# Patient Record
Sex: Male | Born: 1970 | Race: Black or African American | Hispanic: No | Marital: Single | Smoking: Former smoker
Health system: Southern US, Community
[De-identification: ages and names within clinical notes are randomized; demographics above are authoritative.]

---

## 1999-05-15 ENCOUNTER — Emergency Department (HOSPITAL_COMMUNITY): Admission: EM | Admit: 1999-05-15 | Discharge: 1999-05-15 | Payer: Self-pay | Admitting: *Deleted

## 1999-10-10 ENCOUNTER — Emergency Department (HOSPITAL_COMMUNITY): Admission: EM | Admit: 1999-10-10 | Discharge: 1999-10-10 | Payer: Self-pay | Admitting: Emergency Medicine

## 2000-05-20 ENCOUNTER — Emergency Department (HOSPITAL_COMMUNITY): Admission: EM | Admit: 2000-05-20 | Discharge: 2000-05-20 | Payer: Self-pay | Admitting: Emergency Medicine

## 2003-07-13 ENCOUNTER — Emergency Department (HOSPITAL_COMMUNITY): Admission: EM | Admit: 2003-07-13 | Discharge: 2003-07-13 | Payer: Self-pay | Admitting: Emergency Medicine

## 2005-06-21 ENCOUNTER — Emergency Department (HOSPITAL_COMMUNITY): Admission: EM | Admit: 2005-06-21 | Discharge: 2005-06-21 | Payer: Self-pay | Admitting: Emergency Medicine

## 2007-04-22 IMAGING — CR DG CHEST 2V
2 series · 2 of 2 positions shown · non-contrast
Comparison: none

HISTORY: Left chest pain

CHEST 2 VIEWS:
No prior exam for comparison.
Normal heart size, mediastinal contours, and vascularity.
Lungs clear.
No effusion or pneumothorax.
Bones unremarkable.

[w chest pa]
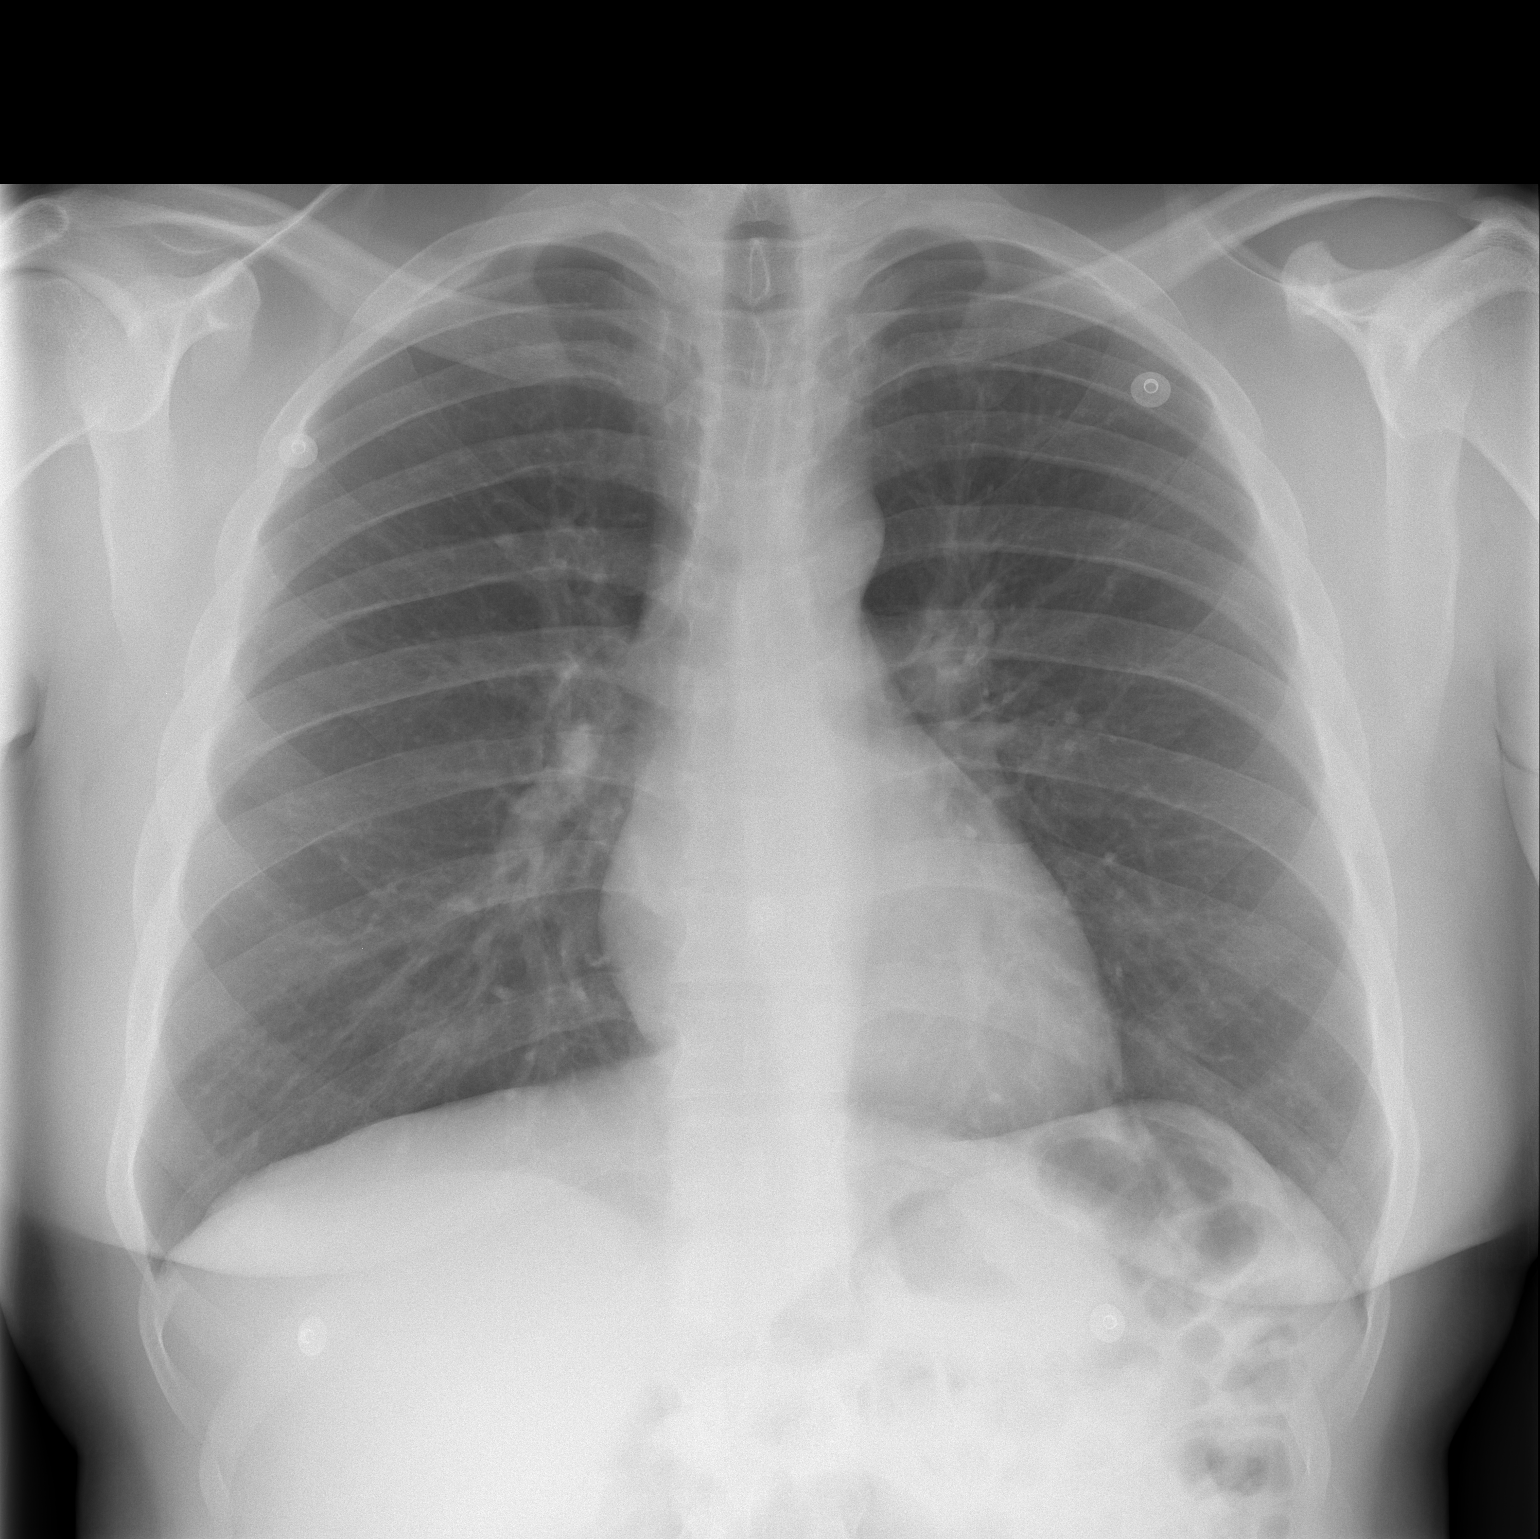

[w chest lat]
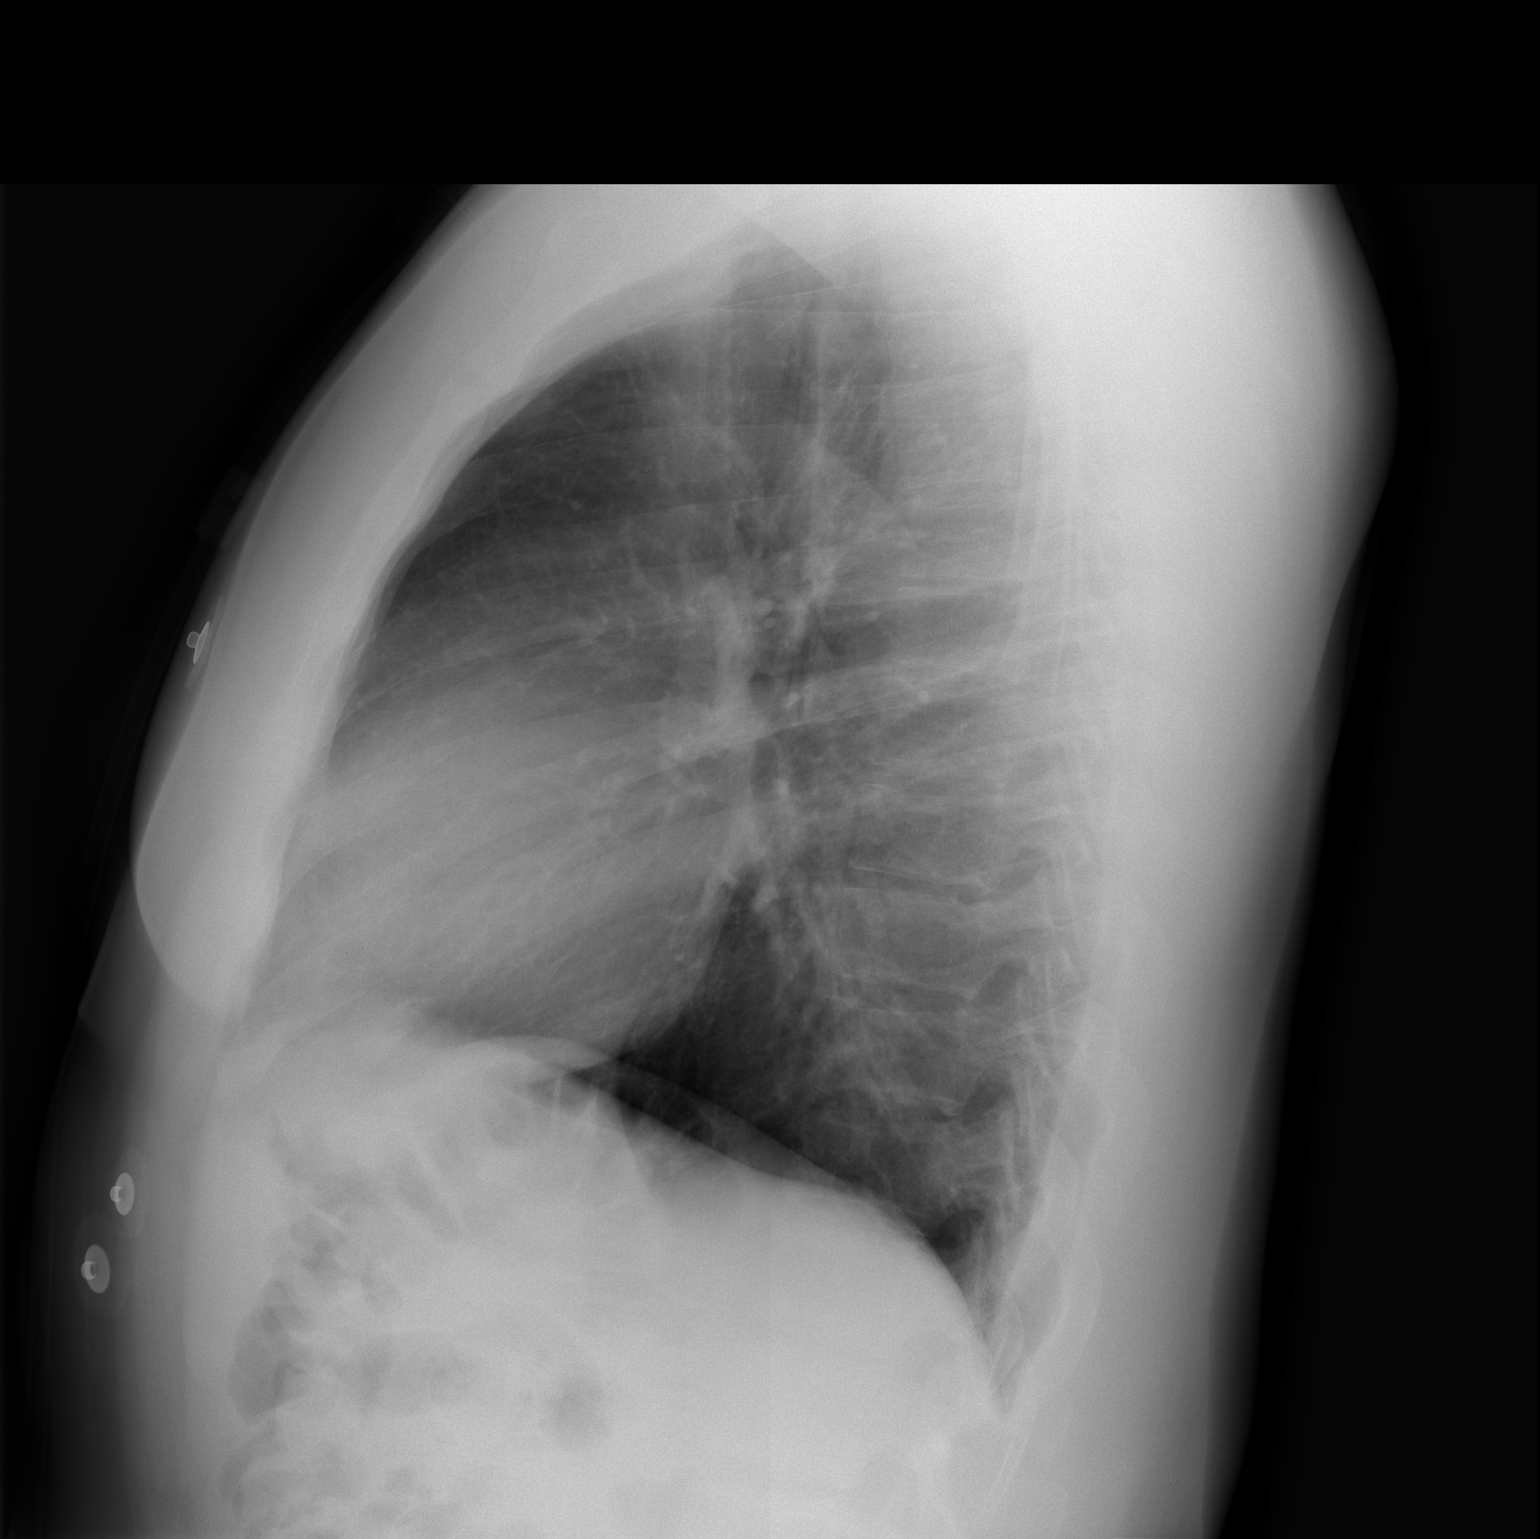

[2 of 2 positions shown; findings below may reference images not displayed]

IMPRESSION: No acute abnormalities.

## 2010-08-06 ENCOUNTER — Inpatient Hospital Stay (INDEPENDENT_AMBULATORY_CARE_PROVIDER_SITE_OTHER)
Admission: RE | Admit: 2010-08-06 | Discharge: 2010-08-06 | Disposition: A | Discharge status: Home / Self Care | Payer: Self-pay | Source: Ambulatory Visit | Attending: Pediatrics | Admitting: Pediatrics

## 2010-08-06 DIAGNOSIS — L98499 Non-pressure chronic ulcer of skin of other sites with unspecified severity: Secondary | ICD-10-CM

## 2010-08-06 LAB — GLUCOSE, CAPILLARY: Glucose-Capillary: 91 mg/dL (ref 70–99)

## 2010-08-08 LAB — WOUND CULTURE

## 2010-08-10 ENCOUNTER — Encounter (HOSPITAL_BASED_OUTPATIENT_CLINIC_OR_DEPARTMENT_OTHER): Payer: Self-pay | Attending: General Surgery

## 2010-08-10 DIAGNOSIS — L97209 Non-pressure chronic ulcer of unspecified calf with unspecified severity: Secondary | ICD-10-CM | POA: Insufficient documentation

## 2010-09-14 ENCOUNTER — Encounter (HOSPITAL_BASED_OUTPATIENT_CLINIC_OR_DEPARTMENT_OTHER): Payer: Self-pay | Attending: General Surgery

## 2010-09-14 DIAGNOSIS — L97209 Non-pressure chronic ulcer of unspecified calf with unspecified severity: Secondary | ICD-10-CM | POA: Insufficient documentation
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
## 2012-09-19 ENCOUNTER — Emergency Department (HOSPITAL_COMMUNITY)
Admission: EM | Admit: 2012-09-19 | Discharge: 2012-09-19 | Disposition: A | Discharge status: Home / Self Care | Payer: No Typology Code available for payment source | Attending: Emergency Medicine | Admitting: Emergency Medicine

## 2012-09-19 ENCOUNTER — Encounter (HOSPITAL_COMMUNITY): Payer: Self-pay | Admitting: Emergency Medicine

## 2012-09-19 DIAGNOSIS — Y9389 Activity, other specified: Secondary | ICD-10-CM | POA: Insufficient documentation

## 2012-09-19 DIAGNOSIS — Y92410 Unspecified street and highway as the place of occurrence of the external cause: Secondary | ICD-10-CM | POA: Insufficient documentation

## 2012-09-19 DIAGNOSIS — S335XXA Sprain of ligaments of lumbar spine, initial encounter: Secondary | ICD-10-CM | POA: Insufficient documentation

## 2012-09-19 DIAGNOSIS — T148XXA Other injury of unspecified body region, initial encounter: Secondary | ICD-10-CM

## 2012-09-19 MED ORDER — IBUPROFEN 600 MG PO TABS: 600.0000 mg | ORAL_TABLET | Freq: Four times a day (QID) | ORAL | Status: DC | PRN

## 2012-09-19 MED ORDER — METHOCARBAMOL 750 MG PO TABS: 750.0000 mg | ORAL_TABLET | Freq: Four times a day (QID) | ORAL | Status: DC
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
## 2012-10-11 ENCOUNTER — Emergency Department (HOSPITAL_COMMUNITY)
Admission: EM | Admit: 2012-10-11 | Discharge: 2012-10-11 | Disposition: A | Discharge status: Home / Self Care | Payer: Self-pay | Attending: Emergency Medicine | Admitting: Emergency Medicine

## 2012-10-11 DIAGNOSIS — T6391XA Toxic effect of contact with unspecified venomous animal, accidental (unintentional), initial encounter: Secondary | ICD-10-CM | POA: Insufficient documentation

## 2012-10-11 DIAGNOSIS — T63461A Toxic effect of venom of wasps, accidental (unintentional), initial encounter: Secondary | ICD-10-CM | POA: Insufficient documentation

## 2012-10-11 DIAGNOSIS — Y929 Unspecified place or not applicable: Secondary | ICD-10-CM | POA: Insufficient documentation

## 2012-10-11 DIAGNOSIS — Y939 Activity, unspecified: Secondary | ICD-10-CM | POA: Insufficient documentation

## 2012-10-11 MED ORDER — PREDNISONE 20 MG PO TABS
60.0000 mg | ORAL_TABLET | Freq: Once | ORAL | Status: AC
  Administered 2012-10-11: 60 mg via ORAL
  Filled 2012-10-11: qty 3

## 2012-10-11 MED ORDER — PREDNISONE 20 MG PO TABS: 40.0000 mg | ORAL_TABLET | Freq: Every day | ORAL | Status: DC

## 2012-10-11 MED ORDER — EPINEPHRINE 0.3 MG/0.3ML IJ SOAJ: 0.3000 mg | Freq: Once | INTRAMUSCULAR | Status: DC

## 2012-10-11 MED ORDER — DIPHENHYDRAMINE HCL 25 MG PO CAPS
50.0000 mg | ORAL_CAPSULE | Freq: Once | ORAL | Status: AC
  Administered 2012-10-11: 50 mg via ORAL
  Filled 2012-10-11: qty 2

## 2012-10-11 MED ORDER — FAMOTIDINE 20 MG PO TABS: 20.0000 mg | ORAL_TABLET | Freq: Two times a day (BID) | ORAL | Status: DC

## 2012-10-11 MED ORDER — DIPHENHYDRAMINE HCL 25 MG PO TABS: 25.0000 mg | ORAL_TABLET | Freq: Four times a day (QID) | ORAL | Status: DC

## 2012-10-11 MED ORDER — EPINEPHRINE 0.3 MG/0.3ML IJ SOAJ: 0.3000 mg | INTRAMUSCULAR | Status: DC | PRN

## 2012-10-11 MED ORDER — FAMOTIDINE 20 MG PO TABS
20.0000 mg | ORAL_TABLET | Freq: Once | ORAL | Status: AC
  Administered 2012-10-11: 20 mg via ORAL
  Filled 2012-10-11: qty 1

## 2012-10-11 NOTE — ED Provider Notes (Signed)
Patient is now 3 hours post reaction to bee sting on the lip there is no further swelling, no respiratory compromise  Patient DC home with Rx for Epipen, Benadryl, Pepcid and Prednisone   Arman Filter, NP 10/11/12 2101  Arman Filter, NP 10/11/12 2102

## 2012-10-11 NOTE — ED Provider Notes (Signed)
Medical screening examination/treatment/procedure(s) were performed by non-physician practitioner and as supervising physician I was immediately available for consultation/collaboration.  Chaos Carlile T Alajiah Dutkiewicz, MD 10/11/12 2038 
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
## 2012-10-11 NOTE — ED Notes (Signed)
Bed:WA04<BR> Expected date:<BR> Expected time:<BR> Means of arrival:<BR> Comments:<BR>

## 2012-10-11 NOTE — ED Provider Notes (Signed)
History    CSN: [id number] Arrival date & time 10/11/12  1821  First MD Initiated Contact with Patient 10/11/12 1829     Chief Complaint  Patient presents with  . Insect Bite   (Consider location/radiation/quality/duration/timing/severity/associated sxs/prior Treatment) HPI Comments: Patient presents with complaint of bee sting and facial swelling approximately 30 minutes prior to arrival. Patient was stung by a bee on the upper lip and sustained swelling of the lips and chin. No treatments prior to arrival. No difficulty breathing, mouth swelling. No wheezing. No diarrhea, hives, or lightheadedness. No previous history of allergy to bee stings. The onset of this condition was acute. The course is gradually worsening. Aggravating factors: none. Alleviating factors: none.    The history is provided by the patient.   No past medical history on file. No past surgical history on file. No family history on file. History  Substance Use Topics  . Smoking status: Never Smoker   . Smokeless tobacco: Not on file  . Alcohol Use: No    Review of Systems  Constitutional: Negative for fever.  HENT: Positive for facial swelling. Negative for trouble swallowing.   Eyes: Negative for redness.  Respiratory: Negative for shortness of breath, wheezing and stridor.   Cardiovascular: Negative for chest pain.  Gastrointestinal: Negative for nausea and vomiting.  Musculoskeletal: Negative for myalgias.  Skin: Negative for rash.  Neurological: Negative for light-headedness.  Psychiatric/Behavioral: Negative for confusion.    Allergies  Review of patient's allergies indicates no known allergies.  Home Medications   Current Outpatient Rx  Name  Route  Sig  Dispense  Refill  . Acetaminophen-Caffeine 500-65 MG TABS   Oral   Take 0.5 tablets by mouth daily as needed (for back and body pain).         Marland Kitchen ibuprofen (ADVIL,MOTRIN) 600 MG tablet   Oral   Take 1 tablet (600 mg total) by mouth  every 6 (six) hours as needed for pain.   30 tablet   0   . methocarbamol (ROBAXIN-750) 750 MG tablet   Oral   Take 1 tablet (750 mg total) by mouth 4 (four) times daily.   30 tablet   0    BP 116/66  Pulse 74  Temp(Src) 98.9 F (37.2 C) (Oral)  SpO2 99% Physical Exam  Nursing note and vitals reviewed. Constitutional: He appears well-developed and well-nourished.  HENT:  Head: Normocephalic and atraumatic.  Moderate to lip, chin, and cheek swelling. No respiratory distress or difficulty breathing, swallowing.  Eyes: Conjunctivae are normal. Right eye exhibits no discharge. Left eye exhibits no discharge.  Neck: Normal range of motion. Neck supple.  Cardiovascular: Normal rate, regular rhythm and normal heart sounds.   Pulmonary/Chest: Effort normal and breath sounds normal. He has no wheezes.  Abdominal: Soft. There is no tenderness.  Neurological: He is alert.  Skin: Skin is warm and dry.  Psychiatric: He has a normal mood and affect.    ED Course  Procedures (including critical care time) Labs Reviewed - No data to display No results found. 1. Allergic reaction to bee sting, initial encounter     6:40 PM Patient seen and examined. Facial swelling only. No anaphylaxis. No airway involvement. edications ordered.   Vital signs reviewed and are as follows: Filed Vitals:   10/11/12 1826  BP: 116/66  Pulse: 74  Temp: 98.9 F (37.2 C)   8:32 PM Handoff to Manus Rudd NP at shift change who will monitor and d/c when stable.  MDM  Facial swelling due to bee sting, no airway compromise/anaphylaxis. Continue monitoring to ensure stable.   Renne Crigler, PA-C 10/11/12 2033

## 2012-10-13 NOTE — ED Provider Notes (Signed)
Medical screening examination/treatment/procedure(s) were performed by non-physician practitioner and as supervising physician I was immediately available for consultation/collaboration.  Toy Baker, MD 10/13/12 [phone]

## 2017-08-23 ENCOUNTER — Emergency Department (HOSPITAL_COMMUNITY)
Admission: EM | Admit: 2017-08-23 | Discharge: 2017-08-23 | Disposition: A | Discharge status: Home / Self Care | Payer: No Typology Code available for payment source | Attending: Emergency Medicine | Admitting: Emergency Medicine

## 2017-08-23 ENCOUNTER — Encounter (HOSPITAL_COMMUNITY): Payer: Self-pay

## 2017-08-23 ENCOUNTER — Other Ambulatory Visit: Payer: Self-pay

## 2017-08-23 DIAGNOSIS — Y999 Unspecified external cause status: Secondary | ICD-10-CM | POA: Diagnosis not present

## 2017-08-23 DIAGNOSIS — Y929 Unspecified place or not applicable: Secondary | ICD-10-CM | POA: Insufficient documentation

## 2017-08-23 DIAGNOSIS — S3992XA Unspecified injury of lower back, initial encounter: Secondary | ICD-10-CM | POA: Diagnosis present

## 2017-08-23 DIAGNOSIS — M62830 Muscle spasm of back: Secondary | ICD-10-CM

## 2017-08-23 DIAGNOSIS — M545 Low back pain, unspecified: Secondary | ICD-10-CM

## 2017-08-23 DIAGNOSIS — S39012A Strain of muscle, fascia and tendon of lower back, initial encounter: Secondary | ICD-10-CM

## 2017-08-23 DIAGNOSIS — Y9389 Activity, other specified: Secondary | ICD-10-CM | POA: Insufficient documentation

## 2017-08-23 MED ORDER — NAPROXEN 500 MG PO TABS
500.0000 mg | ORAL_TABLET | Freq: Once | ORAL | Status: AC
  Administered 2017-08-23: 500 mg via ORAL
  Filled 2017-08-23: qty 1

## 2017-08-23 MED ORDER — CYCLOBENZAPRINE HCL 10 MG PO TABS: 10.0000 mg | ORAL_TABLET | Freq: Three times a day (TID) | ORAL | 0 refills | Status: DC | PRN

## 2017-08-23 MED ORDER — NAPROXEN 500 MG PO TABS: 500.0000 mg | ORAL_TABLET | Freq: Two times a day (BID) | ORAL | 0 refills | Status: DC | PRN

## 2017-08-23 NOTE — Discharge Instructions (Signed)
Take naprosyn as directed for inflammation and pain with tylenol for breakthrough pain and flexeril for muscle relaxation. Do not drive or operate machinery with muscle relaxant use. Use heat to areas of soreness, no more than 20 minutes at a time every hour. Expect to be sore for the next few days and follow up with primary care physician for recheck of ongoing symptoms in the next 1-2 weeks. Return to ER for emergent changing or worsening of symptoms.  °  °
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
## 2017-08-23 NOTE — ED Notes (Signed)
Bed: WTR5 Expected date:  Expected time:  Means of arrival:  Comments: 

## 2017-10-28 ENCOUNTER — Emergency Department (HOSPITAL_COMMUNITY)
Admission: EM | Admit: 2017-10-28 | Discharge: 2017-10-28 | Disposition: A | Discharge status: Home / Self Care | Payer: Self-pay | Attending: Emergency Medicine | Admitting: Emergency Medicine

## 2017-10-28 ENCOUNTER — Encounter (HOSPITAL_COMMUNITY): Payer: Self-pay | Admitting: Emergency Medicine

## 2017-10-28 DIAGNOSIS — R600 Localized edema: Secondary | ICD-10-CM | POA: Insufficient documentation

## 2017-10-28 DIAGNOSIS — F17290 Nicotine dependence, other tobacco product, uncomplicated: Secondary | ICD-10-CM | POA: Insufficient documentation

## 2017-10-28 LAB — CBC WITH DIFFERENTIAL/PLATELET
BASOS PCT: 0 %
Basophils Absolute: 0 10*3/uL (ref 0.0–0.1)
EOS ABS: 0.2 10*3/uL (ref 0.0–0.7)
EOS PCT: 3 %
HCT: 40.8 % (ref 39.0–52.0)
HEMOGLOBIN: 13.5 g/dL (ref 13.0–17.0)
Lymphocytes Relative: 31 %
Lymphs Abs: 2.3 10*3/uL (ref 0.7–4.0)
MCH: 31 pg (ref 26.0–34.0)
MCHC: 33.1 g/dL (ref 30.0–36.0)
MCV: 93.8 fL (ref 78.0–100.0)
Monocytes Absolute: 0.6 10*3/uL (ref 0.1–1.0)
Monocytes Relative: 8 %
NEUTROS PCT: 58 %
Neutro Abs: 4.3 10*3/uL (ref 1.7–7.7)
PLATELETS: 201 10*3/uL (ref 150–400)
RBC: 4.35 MIL/uL (ref 4.22–5.81)
RDW: 14.4 % (ref 11.5–15.5)
WBC: 7.4 10*3/uL (ref 4.0–10.5)

## 2017-10-28 LAB — COMPREHENSIVE METABOLIC PANEL
ALK PHOS: 105 U/L (ref 38–126)
ALT: 42 U/L (ref 0–44)
AST: 26 U/L (ref 15–41)
Albumin: 3.4 g/dL — ABNORMAL LOW (ref 3.5–5.0)
Anion gap: 6 (ref 5–15)
BUN: 18 mg/dL (ref 6–20)
CALCIUM: 9.1 mg/dL (ref 8.9–10.3)
CO2: 28 mmol/L (ref 22–32)
Chloride: 106 mmol/L (ref 98–111)
Creatinine, Ser: 1.1 mg/dL (ref 0.61–1.24)
Glucose, Bld: 97 mg/dL (ref 70–99)
Potassium: 4.4 mmol/L (ref 3.5–5.1)
Sodium: 140 mmol/L (ref 135–145)
Total Protein: 6.3 g/dL — ABNORMAL LOW (ref 6.5–8.1)

## 2017-10-28 NOTE — ED Provider Notes (Signed)
[facility]-EMERGENCY DEPT Provider Note   CSN: [id number] Arrival date & time: 10/28/17  7829     History   Chief Complaint Chief Complaint  Patient presents with  . Leg Swelling    HPI [name] is a 47 y.o. male.  HPI  47 year old male presents with bilateral lower extremity swelling.  Started 2 days ago.  It was at its worst last night and slightly improved this morning.  He is never had this before.  The swelling seems to be slightly worse on the left foot than the right but overall the swelling is equal and goes up to his bilateral knees.  There is no chest pain, shortness of breath.  No abdominal pain or distention.  No swelling in his arms or hands.  He has not tried anything for this.  He denies any significant past medical history.  No recent travel or immobilization.  History reviewed. No pertinent past medical history.  There are no active problems to display for this patient.   History reviewed. No pertinent surgical history.      Home Medications    Prior to Admission medications   Medication Sig Start Date End Date Taking? Authorizing Provider  cyclobenzaprine (FLEXERIL) 10 MG tablet Take 1 tablet (10 mg total) by mouth 3 (three) times daily as needed for muscle spasms. Patient not taking: Reported on 10/28/2017 08/23/17   Street, [place], PA-C  diphenhydrAMINE (BENADRYL) 25 MG tablet Take 1 tablet (25 mg total) by mouth every 6 (six) hours. Patient not taking: Reported on 10/28/2017 10/11/12   Renne Crigler, PA-C  EPINEPHrine (EPIPEN) 0.3 mg/0.3 mL SOAJ Inject 0.3 mLs (0.3 mg total) into the muscle as needed. Patient not taking: Reported on 10/28/2017 10/11/12   Renne Crigler, PA-C  famotidine (PEPCID) 20 MG tablet Take 1 tablet (20 mg total) by mouth 2 (two) times daily. Patient not taking: Reported on 10/28/2017 10/11/12   Renne Crigler, PA-C  ibuprofen (ADVIL,MOTRIN) 600 MG tablet Take 1 tablet (600 mg total) by mouth every 6 (six)  hours as needed for pain. Patient not taking: Reported on 10/28/2017 09/19/12   Lorre Nick, MD  methocarbamol (ROBAXIN-750) 750 MG tablet Take 1 tablet (750 mg total) by mouth 4 (four) times daily. Patient not taking: Reported on 10/28/2017 09/19/12   Lorre Nick, MD  naproxen (NAPROSYN) 500 MG tablet Take 1 tablet (500 mg total) by mouth 2 (two) times daily as needed for mild pain, moderate pain or headache (TAKE WITH MEALS.). Patient not taking: Reported on 10/28/2017 08/23/17   Street, [place], PA-C  predniSONE (DELTASONE) 20 MG tablet Take 2 tablets (40 mg total) by mouth daily. Patient not taking: Reported on 10/28/2017 10/11/12   Renne Crigler, PA-C    Family History No family history on file.  Social History Social History   Tobacco Use  . Smoking status: Light Tobacco Smoker    Types: Cigars  . Smokeless tobacco: Never Used  Substance Use Topics  . Alcohol use: No  . Drug use: No     Allergies   Bee venom   Review of Systems Review of Systems  Respiratory: Negative for shortness of breath.   Cardiovascular: Positive for leg swelling. Negative for chest pain.  Musculoskeletal: Negative for myalgias.  Neurological: Negative for weakness and numbness.  All other systems reviewed and are negative.    Physical Exam Updated Vital Signs BP 102/80 (BP Location: Left Arm)   Pulse (!) 55   Temp 98.6 F (37  C) (Oral)   Resp 20   SpO2 98%   Physical Exam  Constitutional: He is oriented to person, place, and time. He appears well-developed and well-nourished.  HENT:  Head: Normocephalic and atraumatic.  Right Ear: External ear normal.  Left Ear: External ear normal.  Nose: Nose normal.  Eyes: Right eye exhibits no discharge. Left eye exhibits no discharge.  Neck: Neck supple.  Cardiovascular: Normal rate, regular rhythm and normal heart sounds.  Pulses:      Dorsalis pedis pulses are 2+ on the right side, and 2+ on the left side.  Pulmonary/Chest: Effort normal  and breath sounds normal. He has no wheezes. He has no rales.  Abdominal: Soft. He exhibits no distension. There is no tenderness.  Musculoskeletal: He exhibits edema.  pitting edema to BLE. Pitting goes to proximal lower legs. Swelling is most prominent in ankles and feet. Normal ROM of ankles.   Neurological: He is alert and oriented to person, place, and time.  Skin: Skin is warm and dry.  Nursing note and vitals reviewed.    ED Treatments / Results  Labs (all labs ordered are listed, but only abnormal results are displayed) Labs Reviewed  COMPREHENSIVE METABOLIC PANEL - Abnormal; Notable for the following components:      Result Value   Total Protein 6.3 (*)    Albumin 3.4 (*)    Total Bilirubin <0.1 (*)    All other components within normal limits  CBC WITH DIFFERENTIAL/PLATELET    EKG None  Radiology No results found.  Procedures Procedures (including critical care time)  Medications Ordered in ED Medications - No data to display   Initial Impression / Assessment and Plan / ED Course  I have reviewed the triage vital signs and the nursing notes.  Pertinent labs & imaging results that were available during my care of the patient were reviewed by me and considered in my medical decision making (see chart for details).     Patient's exam shows pitting edema but he has no other signs or symptoms of concern such as pulmonary edema/dyspnea or abdominal distention.  His lab work is unremarkable except minimally decreased albumin.  However I do not think this is low enough to cause diffuse edema.  I think this is more of a peripheral cause and he should use compression stockings and elevate his legs when not on his feet.  We have discussed return precautions but that there it appears to be no emergent condition and he can be discharged to follow-up with a PCP.  I highly doubt bilateral DVT do not think ultrasounds are needed.  Final Clinical Impressions(s) / ED Diagnoses    Final diagnoses:  Bilateral lower extremity edema    ED Discharge Orders    None       Pricilla LovelessGoldston, Johnny Gorter, MD 10/28/17 1531

## 2017-10-28 NOTE — Discharge Instructions (Addendum)
Use compression stockings and elevate your legs whenever you are not up and walking around to help decrease the leg swelling.  If you develop chest pain, shortness of breath, trouble laying flat or waking up in the middle the night due to trouble breathing or any other new/concerning symptoms and return to the ER for evaluation.

## 2017-10-28 NOTE — ED Triage Notes (Signed)
Patient here from work with complaints of bilateral leg and ankle swelling that started Sunday.

## 2017-10-28 NOTE — ED Notes (Signed)
Pt had drawn for labs:  Gold Blue Lavender Lt green  dk green x2 

## 2017-11-18 ENCOUNTER — Emergency Department (HOSPITAL_COMMUNITY)
Admission: EM | Admit: 2017-11-18 | Discharge: 2017-11-18 | Disposition: A | Discharge status: Home / Self Care | Payer: Self-pay | Attending: Emergency Medicine | Admitting: Emergency Medicine

## 2017-11-18 ENCOUNTER — Emergency Department (HOSPITAL_COMMUNITY): Payer: Self-pay

## 2017-11-18 ENCOUNTER — Encounter (HOSPITAL_COMMUNITY): Payer: Self-pay | Admitting: *Deleted

## 2017-11-18 ENCOUNTER — Other Ambulatory Visit: Payer: Self-pay

## 2017-11-18 DIAGNOSIS — R0789 Other chest pain: Secondary | ICD-10-CM | POA: Insufficient documentation

## 2017-11-18 DIAGNOSIS — Z8249 Family history of ischemic heart disease and other diseases of the circulatory system: Secondary | ICD-10-CM | POA: Insufficient documentation

## 2017-11-18 DIAGNOSIS — F17210 Nicotine dependence, cigarettes, uncomplicated: Secondary | ICD-10-CM | POA: Insufficient documentation

## 2017-11-18 DIAGNOSIS — R202 Paresthesia of skin: Secondary | ICD-10-CM | POA: Insufficient documentation

## 2017-11-18 LAB — I-STAT TROPONIN, ED
Troponin i, poc: 0 ng/mL (ref 0.00–0.08)
Troponin i, poc: 0 ng/mL (ref 0.00–0.08)

## 2017-11-18 LAB — BASIC METABOLIC PANEL
ANION GAP: 8 (ref 5–15)
BUN: 24 mg/dL — ABNORMAL HIGH (ref 6–20)
CHLORIDE: 106 mmol/L (ref 98–111)
CO2: 25 mmol/L (ref 22–32)
Calcium: 9.2 mg/dL (ref 8.9–10.3)
Creatinine, Ser: 1.26 mg/dL — ABNORMAL HIGH (ref 0.61–1.24)
GFR calc Af Amer: >60 mL/min (ref >60)
GFR calc non Af Amer: >60 mL/min (ref >60)
GLUCOSE: 103 mg/dL — AB (ref 70–99)
POTASSIUM: 4.1 mmol/L (ref 3.5–5.1)
Sodium: 139 mmol/L (ref 135–145)

## 2017-11-18 LAB — CBC
HEMATOCRIT: 43.6 % (ref 39.0–52.0)
HEMOGLOBIN: 14.6 g/dL (ref 13.0–17.0)
MCH: 31.4 pg (ref 26.0–34.0)
MCHC: 33.5 g/dL (ref 30.0–36.0)
MCV: 93.8 fL (ref 78.0–100.0)
Platelets: 245 10*3/uL (ref 150–400)
RBC: 4.65 MIL/uL (ref 4.22–5.81)
RDW: 14.8 % (ref 11.5–15.5)
WBC: 6.6 10*3/uL (ref 4.0–10.5)

## 2017-11-18 NOTE — ED Provider Notes (Signed)
[facility]-EMERGENCY DEPT Provider Note   CSN: [id number] Arrival date & time: 11/18/17  0556     History   Chief Complaint Chief Complaint  Patient presents with  . Chest Pain    HPI Evan Alvarez is a 47 y.o. male with no known past medical history is here for evaluation of chest pain.  Chest pain described as tightness or "someone sitting right there", on the left side of his chest.  Has some left arm discomfort like a "muscle or pinching feeling", tingling in his left arm with tightness.  Had a brief episode of shortness of breath that he describes as breathing harder and gasping but this lasted a few seconds, resolved on its own and not reoccured.  Also had a brief headache that resolved.  Pain began this morning around 4 AM after he woke up and was getting ready for work.  Pain is intermittent, lasting 10 to 15 minutes.  Nothing seems to make it better, happens at rest as well.  Nonpleuritic.  Nonexertional.  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  Does feel like using his left arm makes the pain worse and feels a "pull" to the left side of his chest as well.  No tobacco use.  Rare EtOH use.  Remote history of marijuana use.  Father died of heart attack at 4855.  Older brother had ?Stroke or heart attack recently, unsure.  Was in the ER last month for leg swelling bilaterally, thought right may have been bigger however this resolved after wearing compression stockings.  He has no calf pain.  No history of PE/DVT.  No recent prolonged travel or immobilization.  No sweats, chills, fevers, chest congestion, cough, nausea, vomiting, dizziness, light-headedness. One time last year had similar symptoms but resolved on its own.  No daily medicine. No medical problems. No PCP. Currently minimal left chest tightness and left arm feels cramping.   HPI  History reviewed. No pertinent past medical  history.  There are no active problems to display for this patient.   History reviewed. No pertinent surgical history.      Home Medications    Prior to Admission medications   Medication Sig Start Date End Date Taking? Authorizing Provider  naproxen (NAPROSYN) 500 MG tablet Take 1 tablet (500 mg total) by mouth 2 (two) times daily as needed for mild pain, moderate pain or headache (TAKE WITH MEALS.). 08/23/17  Yes Street, [place]Mercedes, PA-C  cyclobenzaprine (FLEXERIL) 10 MG tablet Take 1 tablet (10 mg total) by mouth 3 (three) times daily as needed for muscle spasms. Patient not taking: Reported on 10/28/2017 08/23/17   Street, HarperMercedes, PA-C  EPINEPHrine (EPIPEN) 0.3 mg/0.3 mL SOAJ Inject 0.3 mLs (0.3 mg total) into the muscle as needed. Patient not taking: Reported on 10/28/2017 10/11/12   Renne CriglerGeiple, Joshua, PA-C    Family History No family history on file.  Social History Social History   Tobacco Use  . Smoking status: Light Tobacco Smoker    Types: Cigars  . Smokeless tobacco: Never Used  Substance Use Topics  . Alcohol use: No  . Drug use: No     Allergies   Bee venom   Review of Systems Review of Systems  Respiratory: Positive for shortness of breath (resolved).   Cardiovascular: Positive for chest pain.  Musculoskeletal: Positive for myalgias (left arm tightness).  Neurological:       Left arm  paresthesia  All other systems reviewed and are negative.    Physical Exam Updated Vital Signs BP 115/76 (BP Location: Left Arm)   Pulse (!) 53   Temp 97.7 F (36.5 C)   Resp 13   SpO2 98%   Physical Exam  Constitutional: He appears well-developed and well-nourished.  NAD. Non toxic.   HENT:  Head: Normocephalic and atraumatic.  Nose: Nose normal.  Eyes: Conjunctivae, EOM and lids are normal.  Neck: Trachea normal and normal range of motion.  Trachea midline.   Cardiovascular: Normal rate, regular rhythm, S1 normal, S2 normal and normal heart sounds.  Pulses:       Carotid pulses are 2+ on the right side, and 2+ on the left side.      Radial pulses are 2+ on the right side, and 2+ on the left side.       Dorsalis pedis pulses are 2+ on the right side, and 2+ on the left side.  No LE edema or calf tenderness.   Pulmonary/Chest: Effort normal and breath sounds normal.  TTP to left upper chest and anterior axillary region. CP reproducible with AROM of left upper extremity against resistance. No pain with deep inspiration.   Abdominal: Soft. Bowel sounds are normal. There is no tenderness.  No epigastric tenderness. No distention.   Neurological: He is alert. GCS eye subscore is 4. GCS verbal subscore is 5. GCS motor subscore is 6.  5/5 strength hand grip bilaterally. Sensation to light touch intact in hands.   Skin: Skin is warm and dry. Capillary refill takes less than 2 seconds.  No rash to chest wall  Psychiatric: His speech is normal and behavior is normal. Thought content normal. Cognition and memory are normal.     ED Treatments / Results  Labs (all labs ordered are listed, but only abnormal results are displayed) Labs Reviewed  BASIC METABOLIC PANEL - Abnormal; Notable for the following components:      Result Value   Glucose, Bld 103 (*)    BUN 24 (*)    Creatinine, Ser 1.26 (*)    All other components within normal limits  CBC  I-STAT TROPONIN, ED  I-STAT TROPONIN, ED    EKG EKG Interpretation  Date/Time:  Tuesday November 18 2017 06:04:10 EDT Ventricular Rate:  61 PR Interval:    QRS Duration: 62 QT Interval:  354 QTC Calculation: 357 R Axis:   37 Text Interpretation:  Sinus arrhythmia Probable left atrial enlargement Low voltage, precordial leads No significant change since last tracing Confirmed by Shaune PollackIsaacs, Cameron [phone]([id number]) on 11/18/2017 7:44:53 AM   Radiology Dg Chest 2 View  Result Date: 11/18/2017 CLINICAL DATA:  New onset left mid chest pain radiating down the left arm since 400 hours this morning. Nonsmoker. EXAM:  CHEST - 2 VIEW COMPARISON:  06/21/2005 FINDINGS: The heart size and mediastinal contours are within normal limits. Both lungs are clear. The visualized skeletal structures are unremarkable. IMPRESSION: No active cardiopulmonary disease. Electronically Signed   By: Burman NievesWilliam  Stevens M.D.   On: 11/18/2017 06:49    Procedures Procedures (including critical care time)  Medications Ordered in ED Medications - No data to display   Initial Impression / Assessment and Plan / ED Course  I have reviewed the triage vital signs and the nursing notes.  Pertinent labs & imaging results that were available during my care of the patient were reviewed by me and considered in my medical decision making (see chart for details).  Clinical  Course as of Nov 19 1038  Tue Nov 18, 2017  6578 Creatinine(!): 1.26 [CG]    Clinical Course User Index [CG] Liberty Handy, PA-C    Pt is a 47 y.o. male presents with CP.  Symptoms have been intermittent, non pleuritic, non exertional.  Worse with AROM of arm, does heavy lifting at work.  Pertinent risk factors include elevated BMI, positive family hx.  On exam VS are wnl. Cardiovascular and pulmonary exam benign. CXR, EKG, troponin x 2 within normal limits.  CBC and BMP unremarkable. I have low suspicion for PE, no tachycardia, tachypnea, pleuritic CP, calf pain or swelling, or recent travel/prolonged immobilization. PERC negative. Heart score is low.  Given symptoms, reassuring ED work up, low risk HEART score patient will be discharged with recommendation to follow up with PCP and cardiologist in regards to today's hospital visit. ED return preacutions given. Pt appears reliable for follow up and is agreeable to discharge.    Final Clinical Impressions(s) / ED Diagnoses   Final diagnoses:  Atypical chest pain    ED Discharge Orders    None       Liberty Handy, PA-C 11/18/17 1040    Cardama, Amadeo Garnet, MD 11/19/17 [phone]

## 2017-11-18 NOTE — Discharge Instructions (Addendum)
You were evaluated in the emergency department for chest pain.    Based on your risk factors, work up and exam you are considered low risk for major adverse cardiac events in the next 30 days.  This means you can be discharged with close follow up with cardiology for further outpatient work up.   Call cardiology as soon as possible to establish care and further discussion and work up of your symptoms on an outpatient setting  Please return to ED if: Your chest pain is worse. You have a cough that gets worse, or you cough up blood. You have severe pain in your abdomen. You have severe weakness. You faint. You have sudden, unexplained chest discomfort. You have sudden, unexplained discomfort in your arms, back, neck, or jaw. You have shortness of breath at any time. You suddenly start to sweat, or your skin gets clammy. You feel nauseous or you vomit. You suddenly feel light-headed or dizzy. Your heart begins to beat quickly, or it feels like it is skipping beats.  Contact [facility] to establish care with a primary care provider for regular, routine medical care.  This clinic accepts patients without medical insurance. A primary care provider can adjust your daily medications and give you refills.

## 2017-11-18 NOTE — ED Triage Notes (Signed)
Pt arrives with c/o cp that started about 4am when it awoke him in his sleep. Describes as a pressure on the left sided of his chest, at worse was a 7/10. Having some pain in the left arm and mid back area.

## 2017-11-23 ENCOUNTER — Emergency Department (HOSPITAL_BASED_OUTPATIENT_CLINIC_OR_DEPARTMENT_OTHER)
Admission: EM | Admit: 2017-11-23 | Discharge: 2017-11-23 | Disposition: A | Discharge status: Home / Self Care | Payer: Self-pay | Attending: Emergency Medicine | Admitting: Emergency Medicine

## 2017-11-23 ENCOUNTER — Other Ambulatory Visit: Payer: Self-pay

## 2017-11-23 ENCOUNTER — Encounter (HOSPITAL_BASED_OUTPATIENT_CLINIC_OR_DEPARTMENT_OTHER): Payer: Self-pay | Admitting: Emergency Medicine

## 2017-11-23 ENCOUNTER — Emergency Department (HOSPITAL_BASED_OUTPATIENT_CLINIC_OR_DEPARTMENT_OTHER): Payer: Self-pay

## 2017-11-23 DIAGNOSIS — L03011 Cellulitis of right finger: Secondary | ICD-10-CM | POA: Insufficient documentation

## 2017-11-23 DIAGNOSIS — F17290 Nicotine dependence, other tobacco product, uncomplicated: Secondary | ICD-10-CM | POA: Insufficient documentation

## 2017-11-23 MED ORDER — HYDROCODONE-ACETAMINOPHEN 5-325 MG PO TABS
1.0000 | ORAL_TABLET | Freq: Once | ORAL | Status: AC
  Administered 2017-11-23: 1 via ORAL
  Filled 2017-11-23: qty 1

## 2017-11-23 MED ORDER — SULFAMETHOXAZOLE-TRIMETHOPRIM 800-160 MG PO TABS
1.0000 | ORAL_TABLET | Freq: Once | ORAL | Status: AC
  Administered 2017-11-23: 1 via ORAL
  Filled 2017-11-23: qty 1

## 2017-11-23 MED ORDER — LIDOCAINE HCL (PF) 1 % IJ SOLN
5.0000 mL | Freq: Once | INTRAMUSCULAR | Status: AC
  Administered 2017-11-23: 5 mL
  Filled 2017-11-23: qty 5

## 2017-11-23 MED ORDER — SULFAMETHOXAZOLE-TRIMETHOPRIM 800-160 MG PO TABS: 1.0000 | ORAL_TABLET | Freq: Two times a day (BID) | ORAL | 0 refills | Status: AC

## 2017-11-23 NOTE — ED Notes (Signed)
Patient transported to X-ray 

## 2017-11-23 NOTE — ED Provider Notes (Signed)
[facility] EMERGENCY DEPARTMENT Provider Note   CSN: [id number] Arrival date & time: 11/23/17  1407     History   Chief Complaint Chief Complaint  Patient presents with  . Finger pain    HPI Evan Alvarez is a 47 y.o. male.  Patient presents to the emergency department with 3-day history of swelling and pain to the right thumb.  Patient works with his hands.  He has a foreign body sensation but does not know of a specific foreign body.  No fevers, numbness.  No treatments prior to arrival.  Patient states that he does not bite at or pick at his nails.     History reviewed. No pertinent past medical history.  There are no active problems to display for this patient.   History reviewed. No pertinent surgical history.      Home Medications    Prior to Admission medications   Medication Sig Start Date End Date Taking? Authorizing Provider  cyclobenzaprine (FLEXERIL) 10 MG tablet Take 1 tablet (10 mg total) by mouth 3 (three) times daily as needed for muscle spasms. Patient not taking: Reported on 10/28/2017 08/23/17   Street, [place]cedes, PA-C  EPINEPHrine (EPIPEN) 0.3 mg/0.3 mL SOAJ Inject 0.3 mLs (0.3 mg total) into the muscle as needed. Patient not taking: Reported on 10/28/2017 10/11/12   Renne CriglerGeiple, Haru Anspaugh, PA-C  naproxen (NAPROSYN) 500 MG tablet Take 1 tablet (500 mg total) by mouth 2 (two) times daily as needed for mild pain, moderate pain or headache (TAKE WITH MEALS.). 08/23/17   Street, [place] Valley[place]cedes, PA-C    Family History No family history on file.  Social History Social History   Tobacco Use  . Smoking status: Light Tobacco Smoker    Types: Cigars  . Smokeless tobacco: Never Used  Substance Use Topics  . Alcohol use: No  . Drug use: No     Allergies   Bee venom   Review of Systems Review of Systems  Constitutional: Negative for activity change.  Musculoskeletal: Positive for arthralgias. Negative for back pain, gait problem, joint swelling and  neck pain.  Skin: Negative for wound.  Neurological: Negative for weakness and numbness.     Physical Exam Updated Vital Signs BP 120/74 (BP Location: Left Arm)   Pulse 80   Temp 98.2 F (36.8 C) (Oral)   Resp 18   Ht 6\' 1"  (1.854 m)   Wt 108.9 kg   SpO2 100%   BMI 31.66 kg/m   Physical Exam  Constitutional: He appears well-developed and well-nourished.  HENT:  Head: Normocephalic and atraumatic.  Eyes: Conjunctivae are normal.  Neck: Normal range of motion. Neck supple.  Cardiovascular: Normal pulses. Exam reveals no decreased pulses.  Musculoskeletal: He exhibits tenderness. He exhibits no edema.       Left hand: He exhibits tenderness and swelling. He exhibits normal range of motion.       Hands: Neurological: He is alert. No sensory deficit.  Motor, sensation, and vascular distal to the injury is fully intact.   Skin: Skin is warm and dry.  Psychiatric: He has a normal mood and affect.  Nursing note and vitals reviewed.    ED Treatments / Results  Labs (all labs ordered are listed, but only abnormal results are displayed) Labs Reviewed - No data to display  EKG None  Radiology Dg Finger Thumb Right  Result Date: 11/23/2017 CLINICAL DATA:  Soft tissue swelling with irritation distal first digit EXAM: RIGHT THUMB 2+V COMPARISON:  None. FINDINGS:  Frontal, oblique, and lateral views obtained. There is soft tissue swelling in the subungual region. No soft tissue air or radiopaque foreign body. No fracture or dislocation. 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 No erosive change. IMPRESSION: Subungual soft tissue swelling. No radiopaque foreign body or soft tissue air. 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 No fracture or dislocation. No erosive change. Electronically Signed   By: Bretta BangWilliam  Woodruff III M.D.   On: 11/23/2017 16:13    Procedures .Marland Kitchen.Incision and Drainage Date/Time: 11/23/2017 5:03 PM Performed by: Renne CriglerGeiple, Marckus Hanover,  PA-C Authorized by: Renne CriglerGeiple, Elisabel Hanover, PA-C   Consent:    Consent obtained:  Verbal   Consent given by:  Patient   Risks discussed:  Incomplete drainage, pain, bleeding and infection   Alternatives discussed:  No treatment Location:    Type:  Abscess   Location:  Upper extremity   Upper extremity location:  Finger   Finger location:  R thumb Pre-procedure details:    Skin preparation:  Betadine Anesthesia (see MAR for exact dosages):    Anesthesia method:  Local infiltration   Local anesthetic:  Lidocaine 1% w/o epi Procedure details:    Needle aspiration: yes     Needle size:  25 G   Incision types:  Stab incision   Drainage:  Purulent   Drainage amount:  Moderate   Packing materials:  None Post-procedure details:    Patient tolerance of procedure:  Tolerated well, no immediate complications   (including critical care time)  Medications Ordered in ED Medications  HYDROcodone-acetaminophen (NORCO/VICODIN) 5-325 MG per tablet 1 tablet (has no administration in time range)  sulfamethoxazole-trimethoprim (BACTRIM DS,SEPTRA DS) 800-160 MG per tablet 1 tablet (has no administration in time range)  lidocaine (PF) (XYLOCAINE) 1 % injection 5 mL (5 mLs Infiltration Given by Other 11/23/17 1548)     Initial Impression / Assessment and Plan / ED Course  I have reviewed the triage vital signs and the nursing notes.  Pertinent labs & imaging results that were available during my care of the patient were reviewed by me and considered in my medical decision making (see chart for details).     Patient seen and examined.  Will obtain x-ray to ensure no radiopaque foreign body.  Patient agrees to proceed with I&D.  Vital signs reviewed and are as follows: BP 120/74 (BP Location: Left Arm)   Pulse 80   Temp 98.2 F (36.8 C) (Oral)   Resp 18   Ht 6\' 1"  (1.854 m)   Wt 108.9 kg   SpO2 100%   BMI 31.66 kg/m   5:02 PM I&D performed. Will give rx Bactrim for 5 days and have patient do  warm soaks at home.   Pt urged to return with worsening pain, worsening swelling, expanding area of redness or streaking up extremity, fever, or any other concerns. Urged to take complete course of antibiotics as prescribed. Pt verbalizes understanding and agrees with plan.   Final Clinical Impressions(s) / ED Diagnoses   Final diagnoses:  Paronychia of finger of right hand   Patient with right thumb paronychia.  X-ray negative for foreign body.  Drained in the ED with good results.   ED Discharge Orders         Ordered    sulfamethoxazole-trimethoprim (BACTRIM DS,SEPTRA DS) 800-160 MG tablet  2 times daily     11/23/17 1659           Renne CriglerGeiple, Doyce Saling, [place] JerseyPA-C 11/23/17 1705  Arby Barrette, MD 11/24/17 1325

## 2017-11-23 NOTE — Discharge Instructions (Signed)
Please read and follow all provided instructions.  Your diagnoses today include:  1. Paronychia of finger of right hand     Tests performed today include:  Vital signs. See below for your results today.   Medications prescribed:   Bactrim (trimethoprim/sulfamethoxazole) - antibiotic  You have been prescribed an antibiotic medicine: take the entire course of medicine even if you are feeling better. Stopping early can cause the antibiotic not to work.  Take any prescribed medications only as directed.   Home care instructions:   Follow any educational materials contained in this packet  Follow-up instructions: Return to the Emergency Department in 48 hours for a recheck if your symptoms are not significantly improved.  Please follow-up with your primary care provider in the next 1 week for further evaluation of your symptoms.   Return instructions:  Return to the Emergency Department if you have:  Fever  Worsening symptoms  Worsening pain  Worsening swelling  Redness of the skin that moves away from the affected area, especially if it streaks away from the affected area   Any other emergent concerns  Your vital signs today were: BP 107/66 (BP Location: Left Arm)    Pulse 62    Temp 98.2 F (36.8 C) (Oral)    Resp 18    Ht 6\' 1"  (1.854 m)    Wt 108.9 kg    SpO2 99%    BMI 31.66 kg/m  If your blood pressure (BP) was elevated above 135/85 this visit, please have this repeated by your doctor within one month. --------------

## 2017-11-23 NOTE — ED Triage Notes (Signed)
Pain and swelling to cuticle of R thumb

## 2017-12-19 ENCOUNTER — Emergency Department (HOSPITAL_BASED_OUTPATIENT_CLINIC_OR_DEPARTMENT_OTHER)
Admission: EM | Admit: 2017-12-19 | Discharge: 2017-12-20 | Disposition: A | Discharge status: Home / Self Care | Payer: Self-pay | Attending: Emergency Medicine | Admitting: Emergency Medicine

## 2017-12-19 ENCOUNTER — Encounter (HOSPITAL_BASED_OUTPATIENT_CLINIC_OR_DEPARTMENT_OTHER): Payer: Self-pay

## 2017-12-19 ENCOUNTER — Other Ambulatory Visit: Payer: Self-pay

## 2017-12-19 ENCOUNTER — Emergency Department (HOSPITAL_BASED_OUTPATIENT_CLINIC_OR_DEPARTMENT_OTHER): Payer: Self-pay

## 2017-12-19 DIAGNOSIS — S93601A Unspecified sprain of right foot, initial encounter: Secondary | ICD-10-CM | POA: Insufficient documentation

## 2017-12-19 DIAGNOSIS — Z79899 Other long term (current) drug therapy: Secondary | ICD-10-CM | POA: Insufficient documentation

## 2017-12-19 DIAGNOSIS — Z87891 Personal history of nicotine dependence: Secondary | ICD-10-CM | POA: Insufficient documentation

## 2017-12-19 DIAGNOSIS — Y999 Unspecified external cause status: Secondary | ICD-10-CM | POA: Insufficient documentation

## 2017-12-19 DIAGNOSIS — W1842XA Slipping, tripping and stumbling without falling due to stepping into hole or opening, initial encounter: Secondary | ICD-10-CM | POA: Insufficient documentation

## 2017-12-19 DIAGNOSIS — Y929 Unspecified place or not applicable: Secondary | ICD-10-CM | POA: Insufficient documentation

## 2017-12-19 DIAGNOSIS — Y939 Activity, unspecified: Secondary | ICD-10-CM | POA: Insufficient documentation

## 2017-12-19 MED ORDER — NAPROXEN 250 MG PO TABS: 500.0000 mg | ORAL_TABLET | Freq: Once | ORAL | Status: DC

## 2017-12-19 MED ORDER — MELOXICAM 15 MG PO TABS: ORAL_TABLET | ORAL | 0 refills | Status: AC

## 2017-12-19 NOTE — ED Triage Notes (Addendum)
C/o right foot injury 4 hours PTA when stepped in a hole-denies ankle pain-entered triage with limping gait/own cane-NAD

## 2017-12-19 NOTE — ED Provider Notes (Signed)
[facility] Provider Note: Lowella DellJ. Lane Siddhanth Denk, MD, FACEP  CSN: [id number] MRN: [id number] ARRIVAL: 12/19/17 at 2227 ROOM: [facility]08/[facility]08   CHIEF COMPLAINT  Foot Injury   HISTORY OF PRESENT ILLNESS  12/19/17 11:06 PM Evan Alvarez is a 47 y.o. male who stepped into a hole about 4 hours ago inverting his right foot.  He is having pain in his right foot at about the base of the fifth metatarsal.  He rates the pain as an 8 out of 10, worse with walking.  There is no deformity or functional deficit.  He denies other injury.   History reviewed. No pertinent past medical history.  History reviewed. No pertinent surgical history.  No family history on file.  Social History   Tobacco Use  . Smoking status: Light Tobacco Smoker    Types: Cigars  . Smokeless tobacco: Never Used  Substance Use Topics  . Alcohol use: No  . Drug use: No    Prior to Admission medications   Medication Sig Start Date End Date Taking? Authorizing Provider  naproxen (NAPROSYN) 500 MG tablet Take 1 tablet (500 mg total) by mouth 2 (two) times daily as needed for mild pain, moderate pain or headache (TAKE WITH MEALS.). 08/23/17   Street, IngramMercedes, PA-C    Allergies Bee venom   REVIEW OF SYSTEMS  Negative except as noted here or in the History of Present Illness.   PHYSICAL EXAMINATION  Initial Vital Signs Blood pressure 118/76, pulse 77, temperature 98.4 F (36.9 C), temperature source Oral, resp. rate 18, height 6' (1.829 m), weight 108.7 kg, SpO2 100 %.  Examination General: Well-developed, well-nourished male in no acute distress; appearance consistent with age of record HENT: normocephalic; atraumatic Eyes: Normal appearance Neck: supple Heart: regular rate and rhythm Lungs: clear to auscultation bilaterally Abdomen: soft; nondistended; nontender; bowel sounds present Extremities: No deformity; full range of motion; pulses normal; tenderness over base of right fifth metatarsal, right  foot distally neurovascularly intact with intact tendon function Neurologic: Awake, alert and oriented; motor function intact in all extremities and symmetric; no facial droop Skin: Warm and dry Psychiatric: Normal mood and affect   RESULTS  Summary of this visit's results, reviewed by myself:   EKG Interpretation  Date/Time:    Ventricular Rate:    PR Interval:    QRS Duration:   QT Interval:    QTC Calculation:   R Axis:     Text Interpretation:        Laboratory Studies: No results found for this or any previous visit (from the past 24 hour(s)). Imaging Studies: Dg Foot Complete Right  Result Date: 12/19/2017 CLINICAL DATA:  Foot injury, stepped in hole with pain to the fifth metatarsal EXAM: RIGHT FOOT COMPLETE - 3+ VIEW COMPARISON:  None. FINDINGS: There is no evidence of fracture or dislocation. There is no evidence of arthropathy or other focal bone abnormality. Soft tissues are unremarkable. IMPRESSION: Negative. Electronically Signed   By: Jasmine PangKim  Fujinaga M.D.   On: 12/19/2017 23:29    ED COURSE and MDM  Nursing notes and initial vitals signs, including pulse oximetry, reviewed.  Vitals:   12/19/17 2240  BP: 118/76  Pulse: 77  Resp: 18  Temp: 98.4 F (36.9 C)  TempSrc: Oral  SpO2: 100%  Weight: 108.7 kg  Height: 6' (1.829 m)    PROCEDURES    ED DIAGNOSES     ICD-10-CM   1. Sprain of right foot, initial encounter S93.601A  Paula Libra, MD 12/19/17 2342

## 2018-08-06 ENCOUNTER — Other Ambulatory Visit: Payer: Self-pay

## 2018-08-06 ENCOUNTER — Emergency Department (HOSPITAL_BASED_OUTPATIENT_CLINIC_OR_DEPARTMENT_OTHER)
Admission: EM | Admit: 2018-08-06 | Discharge: 2018-08-06 | Disposition: A | Discharge status: Home / Self Care | Payer: 59 | Attending: Emergency Medicine | Admitting: Emergency Medicine

## 2018-08-06 ENCOUNTER — Encounter (HOSPITAL_BASED_OUTPATIENT_CLINIC_OR_DEPARTMENT_OTHER): Payer: Self-pay | Admitting: Emergency Medicine

## 2018-08-06 DIAGNOSIS — F17290 Nicotine dependence, other tobacco product, uncomplicated: Secondary | ICD-10-CM | POA: Insufficient documentation

## 2018-08-06 DIAGNOSIS — J029 Acute pharyngitis, unspecified: Secondary | ICD-10-CM | POA: Diagnosis present

## 2018-08-06 DIAGNOSIS — J020 Streptococcal pharyngitis: Secondary | ICD-10-CM | POA: Diagnosis not present

## 2018-08-06 LAB — GROUP A STREP BY PCR: Group A Strep by PCR: DETECTED — AB

## 2018-08-06 MED ORDER — CEPHALEXIN 500 MG PO CAPS: 500.0000 mg | ORAL_CAPSULE | Freq: Four times a day (QID) | ORAL | 0 refills | Status: AC

## 2018-08-06 NOTE — ED Triage Notes (Signed)
Sore throat, intermittent headache, and body aches x 2 days.
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
## 2018-08-06 NOTE — Discharge Instructions (Signed)
Keflex as prescribed.  Ibuprofen 600 mg every 6 hours as needed for pain.  Drink plenty of fluids and get plenty of rest.  Return to the ER for difficulty breathing or swallowing, or other new and concerning symptoms.

## 2018-08-06 NOTE — ED Notes (Signed)
ED Provider at bedside. 

## 2019-02-15 ENCOUNTER — Emergency Department (HOSPITAL_BASED_OUTPATIENT_CLINIC_OR_DEPARTMENT_OTHER)
Admission: EM | Admit: 2019-02-15 | Discharge: 2019-02-15 | Disposition: A | Discharge status: Home / Self Care | Payer: 59 | Attending: Emergency Medicine | Admitting: Emergency Medicine

## 2019-02-15 ENCOUNTER — Emergency Department (HOSPITAL_BASED_OUTPATIENT_CLINIC_OR_DEPARTMENT_OTHER): Payer: 59

## 2019-02-15 ENCOUNTER — Encounter (HOSPITAL_BASED_OUTPATIENT_CLINIC_OR_DEPARTMENT_OTHER): Payer: Self-pay | Admitting: Emergency Medicine

## 2019-02-15 ENCOUNTER — Emergency Department (HOSPITAL_COMMUNITY): Payer: 59

## 2019-02-15 ENCOUNTER — Other Ambulatory Visit: Payer: Self-pay

## 2019-02-15 DIAGNOSIS — Y999 Unspecified external cause status: Secondary | ICD-10-CM | POA: Diagnosis not present

## 2019-02-15 DIAGNOSIS — W01198A Fall on same level from slipping, tripping and stumbling with subsequent striking against other object, initial encounter: Secondary | ICD-10-CM | POA: Insufficient documentation

## 2019-02-15 DIAGNOSIS — Z87891 Personal history of nicotine dependence: Secondary | ICD-10-CM | POA: Insufficient documentation

## 2019-02-15 DIAGNOSIS — W19XXXA Unspecified fall, initial encounter: Secondary | ICD-10-CM

## 2019-02-15 DIAGNOSIS — Y9301 Activity, walking, marching and hiking: Secondary | ICD-10-CM | POA: Insufficient documentation

## 2019-02-15 DIAGNOSIS — Y929 Unspecified place or not applicable: Secondary | ICD-10-CM | POA: Diagnosis not present

## 2019-02-15 DIAGNOSIS — S40021A Contusion of right upper arm, initial encounter: Secondary | ICD-10-CM

## 2019-02-15 DIAGNOSIS — S5011XA Contusion of right forearm, initial encounter: Secondary | ICD-10-CM | POA: Insufficient documentation

## 2019-02-15 MED ORDER — NAPROXEN 500 MG PO TABS: 500.0000 mg | ORAL_TABLET | Freq: Two times a day (BID) | ORAL | 0 refills | Status: AC

## 2019-02-15 MED ORDER — NAPROXEN 250 MG PO TABS
500.0000 mg | ORAL_TABLET | Freq: Once | ORAL | Status: AC
  Administered 2019-02-15: 500 mg via ORAL
  Filled 2019-02-15: qty 2

## 2019-02-15 MED ORDER — CYCLOBENZAPRINE HCL 10 MG PO TABS: 10.0000 mg | ORAL_TABLET | Freq: Two times a day (BID) | ORAL | 0 refills | Status: AC | PRN

## 2019-02-15 NOTE — Discharge Instructions (Signed)
Apply ice to your arm for 20 minutes at a time, 3 times a day.  Take medication as prescribed.

## 2019-02-15 NOTE — ED Provider Notes (Signed)
[facility] EMERGENCY DEPARTMENT Provider Note   CSN: [id number] Arrival date & time: 02/15/19  2014     History   Chief Complaint Chief Complaint  Patient presents with  . Fall    HPI Evan Alvarez is a 48 y.o. male.     48 year old male with no significant past medical history presenting to the emergency department for right forearm pain after a fall.  Patient reports that he is tripped up some stairs and landed on his right mid forearm.  He denies hitting his head or passing out.  Denies any other injuries.  Reports he has pain and swelling.  He has not tried anything for relief.     History reviewed. No pertinent past medical history.  There are no active problems to display for this patient.   History reviewed. No pertinent surgical history.      Home Medications    Prior to Admission medications   Medication Sig Start Date End Date Taking? Authorizing Provider  cephALEXin (KEFLEX) 500 MG capsule Take 1 capsule (500 mg total) by mouth 4 (four) times daily. 08/06/18   Geoffery Lyons, MD  cyclobenzaprine (FLEXERIL) 10 MG tablet Take 1 tablet (10 mg total) by mouth 2 (two) times daily as needed for up to 7 days for muscle spasms. 02/15/19 02/22/19  Arlyn Dunning, PA-C  meloxicam (MOBIC) 15 MG tablet Take 1 tablet daily as needed for pain. 12/19/17   Molpus, John, MD  naproxen (NAPROSYN) 500 MG tablet Take 1 tablet (500 mg total) by mouth 2 (two) times daily. 02/15/19   Arlyn Dunning, PA-C    Family History Family History  Problem Relation Age of Onset  . Heart attack Father     Social History Social History   Tobacco Use  . Smoking status: Former Smoker    Types: Cigars  . Smokeless tobacco: Never Used  Substance Use Topics  . Alcohol use: Yes    Comment: social  . Drug use: No     Allergies   Bee venom   Review of Systems Review of Systems  Constitutional: Negative for appetite change, chills and fever.  Respiratory: Negative for  cough and shortness of breath.   Musculoskeletal: Positive for arthralgias and joint swelling. Negative for back pain, gait problem, myalgias, neck pain and neck stiffness.  Skin: Negative for rash and wound.  Neurological: Negative for headaches.  All other systems reviewed and are negative.    Physical Exam Updated Vital Signs BP 117/78 (BP Location: Left Arm)   Pulse 60   Temp 98.2 F (36.8 C) (Oral)   Resp 18   Ht 6' (1.829 m)   Wt 116.1 kg   SpO2 98%   BMI 34.72 kg/m   Physical Exam Vitals signs and nursing note reviewed.  Constitutional:      Appearance: Normal appearance.  HENT:     Head: Normocephalic.  Eyes:     Conjunctiva/sclera: Conjunctivae normal.  Pulmonary:     Effort: Pulmonary effort is normal.  Musculoskeletal:     Right shoulder: Normal.     Right elbow: Normal.    Right wrist: Normal.     Comments: The lateral mid forearm is with swelling and ecchymosis and tenderness to palpation of the soft tissues.  Patient is neurovascularly intact with normal capillary refill and distal pulses.  Skin:    General: Skin is dry.  Neurological:     Mental Status: He is alert.  Psychiatric:  Mood and Affect: Mood normal.      ED Treatments / Results  Labs (all labs ordered are listed, but only abnormal results are displayed) Labs Reviewed - No data to display  EKG None  Radiology Dg Forearm Right  Result Date: 02/15/2019 CLINICAL DATA:  Fall with swelling EXAM: RIGHT FOREARM - 2 VIEW COMPARISON:  None. FINDINGS: No fracture or malalignment. Protuberant soft tissue swelling at the dorsal mid forearm. No radiopaque foreign body IMPRESSION: No acute osseous abnormality. Protuberant soft tissue swelling at the mid forearm Electronically Signed   By: Donavan Foil M.D.   On: 02/15/2019 20:43    Procedures Procedures (including critical care time)  Medications Ordered in ED Medications  naproxen (NAPROSYN) tablet 500 mg (has no administration in  time range)     Initial Impression / Assessment and Plan / ED Course  I have reviewed the triage vital signs and the nursing notes.  Pertinent labs & imaging results that were available during my care of the patient were reviewed by me and considered in my medical decision making (see chart for details).        Based on review of vitals, medical screening exam, lab work and/or imaging, there does not appear to be an acute, emergent etiology for the patient's symptoms. Counseled pt on good return precautions and encouraged both PCP and ED follow-up as needed.  Prior to discharge, I also discussed incidental imaging findings with patient in detail and advised appropriate, recommended follow-up in detail.  Clinical Impression: 1. Fall, initial encounter   2. Contusion of right upper extremity, initial encounter     Disposition: Discharge  Prior to providing a prescription for a controlled substance, I independently reviewed the patient's recent prescription history on the [facility]. The patient had no recent or regular prescriptions and was deemed appropriate for a brief, less than 3 day prescription of narcotic for acute analgesia.  This note was prepared with assistance of Systems analyst. Occasional wrong-word or sound-a-like substitutions may have occurred due to the inherent limitations of voice recognition software.   Final Clinical Impressions(s) / ED Diagnoses   Final diagnoses:  Fall, initial encounter  Contusion of right upper extremity, initial encounter    ED Discharge Orders         Ordered    cyclobenzaprine (FLEXERIL) 10 MG tablet  2 times daily PRN     02/15/19 2159    naproxen (NAPROSYN) 500 MG tablet  2 times daily     02/15/19 2159           Kristine Royal 02/15/19 2159    Dorie Rank, MD 02/15/19 [phone]

## 2019-02-15 NOTE — ED Triage Notes (Signed)
Pt states he was coming out of the shower and slipped and fell  Pt is c/o pain to his right forearm  Pt has swelling noted

## 2024-01-26 ENCOUNTER — Encounter (HOSPITAL_COMMUNITY): Payer: Self-pay

## 2024-01-26 ENCOUNTER — Other Ambulatory Visit: Payer: Self-pay

## 2024-01-26 ENCOUNTER — Emergency Department (HOSPITAL_COMMUNITY)
Admission: EM | Admit: 2024-01-26 | Discharge: 2024-01-26 | Disposition: A | Discharge status: Home / Self Care | Attending: Emergency Medicine | Admitting: Emergency Medicine

## 2024-01-26 DIAGNOSIS — Y92410 Unspecified street and highway as the place of occurrence of the external cause: Secondary | ICD-10-CM | POA: Insufficient documentation

## 2024-01-26 DIAGNOSIS — M545 Low back pain, unspecified: Secondary | ICD-10-CM | POA: Diagnosis present

## 2024-01-26 MED ORDER — LIDOCAINE 5 % EX PTCH: 1.0000 | MEDICATED_PATCH | CUTANEOUS | 0 refills | Status: AC

## 2024-01-26 MED ORDER — LIDOCAINE 5 % EX PTCH
1.0000 | MEDICATED_PATCH | CUTANEOUS | Status: DC
  Administered 2024-01-26: 1 via TRANSDERMAL
  Filled 2024-01-26: qty 1

## 2024-01-26 MED ORDER — METHOCARBAMOL 500 MG PO TABS: 500.0000 mg | ORAL_TABLET | Freq: Two times a day (BID) | ORAL | 0 refills | Status: AC

## 2024-01-26 MED ORDER — NAPROXEN 500 MG PO TABS: 500.0000 mg | ORAL_TABLET | Freq: Two times a day (BID) | ORAL | 0 refills | Status: AC

## 2024-01-26 MED ORDER — NAPROXEN 500 MG PO TABS
500.0000 mg | ORAL_TABLET | Freq: Once | ORAL | Status: AC
  Administered 2024-01-26: 500 mg via ORAL
  Filled 2024-01-26: qty 1
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
## 2024-01-26 NOTE — Discharge Instructions (Signed)
 You were seen today for low back pain.  I am sending in some muscle relaxers and some anti-inflammatories for you to use.  You additionally can use Tylenol  as well as lidocaine  patches I am sending in for you.  Recommend you continue to follow with PCP if pain persists past 2 to 4 weeks.  Please take Robaxin , 500 mg up to twice a day as needed for muscle spasm, this is a muscle relaxer, it may cause generalized weakness, sleepiness and you should not drive or do important things while taking this medication. Please take Naprosyn , 500mg  by mouth twice daily as needed for pain - this in an antiinflammatory medicine (NSAID) and is similar to ibuprofen  - many people feel that it is stronger than ibuprofen  and it is easier to take since it is a smaller pill.  Please use this only for 1 week - if your pain persists, you will need to follow up with your doctor in the office for ongoing guidance and pain control.
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
